# Patient Record
Sex: Male | Born: 1960 | Race: White | Hispanic: No | Smoking: Current every day smoker
Health system: Southern US, Community
[De-identification: ages and names within clinical notes are randomized; demographics above are authoritative.]

---

## 2005-10-19 ENCOUNTER — Ambulatory Visit: Payer: Self-pay | Admitting: Unknown Physician Specialty

## 2008-02-06 IMAGING — CT CT NECK WITH CONTRAST
1 of 2 series · 9 of 14 positions shown, 12 images · IV contrast (agent unspecified)
Comparison: none

REASON FOR EXAM: NECK MASS, SWELLING
COMMENTS:

PROCEDURE:     CT  - CT NECK WITH CONTRAST  - October 19, 2005  [DATE]
RESULT:
HISTORY: Neck mass, swelling.

[Series 2: soft tissue · axial · 0.44mm/px · z∈[-284,-44]mm · 9 of 101 slices shown, 12 images]
[im 11/101  soft-tissue]
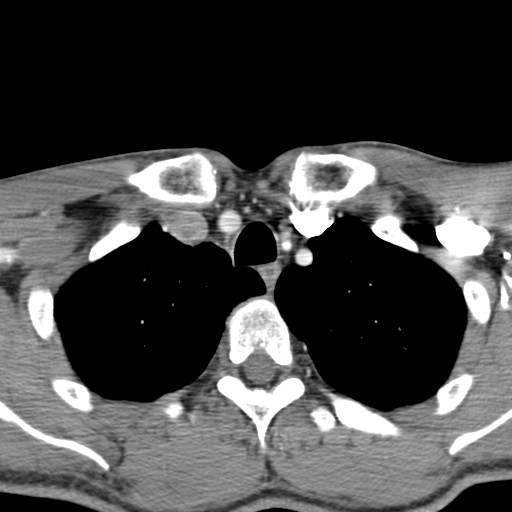
[im 11/101  bone]
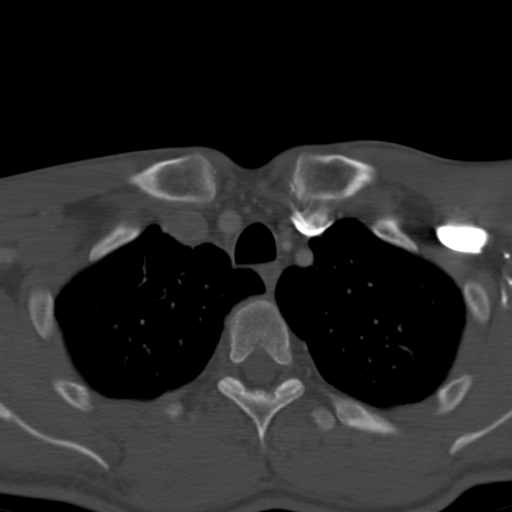
[im 21/101  bone]
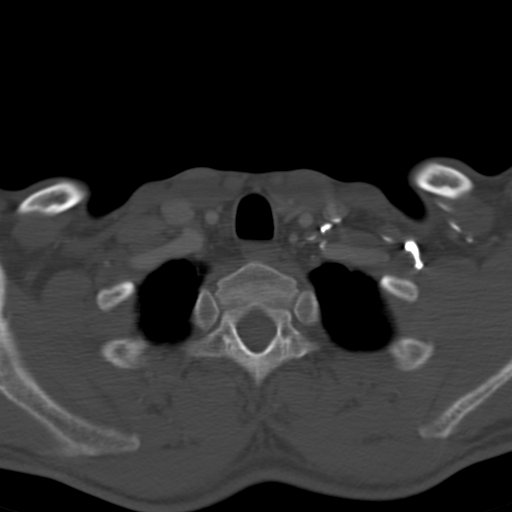
[im 31/101  bone]
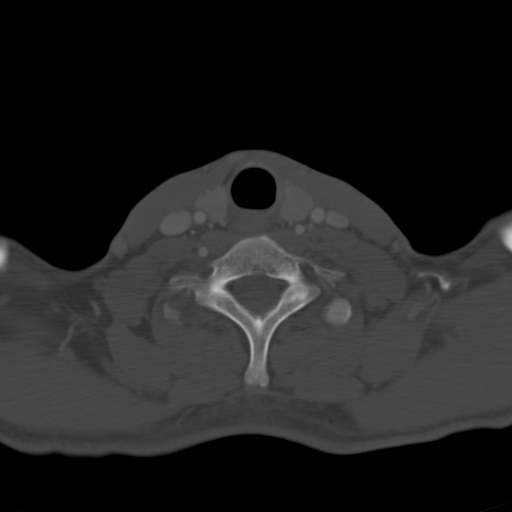
[im 41/101  bone]
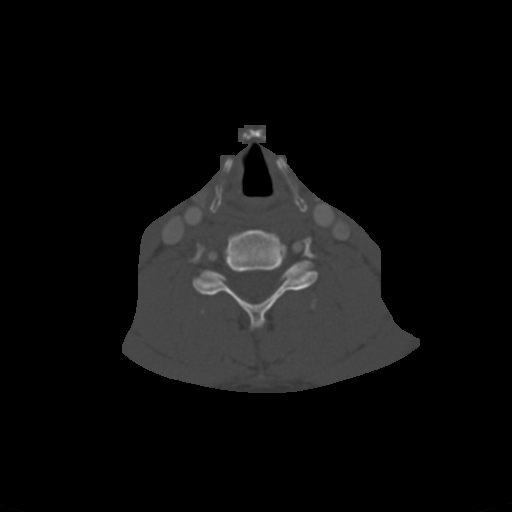
[im 51/101  soft-tissue]
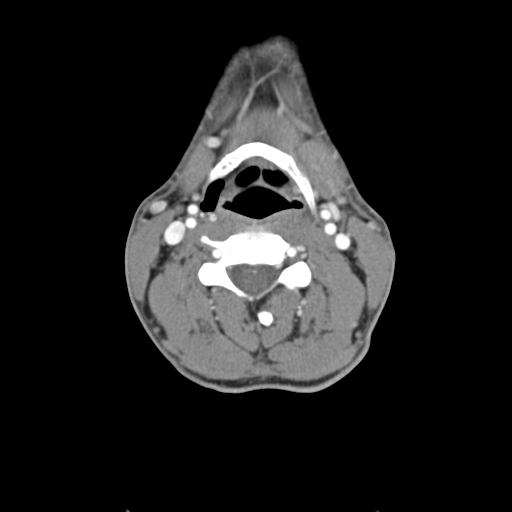
[im 51/101  bone]
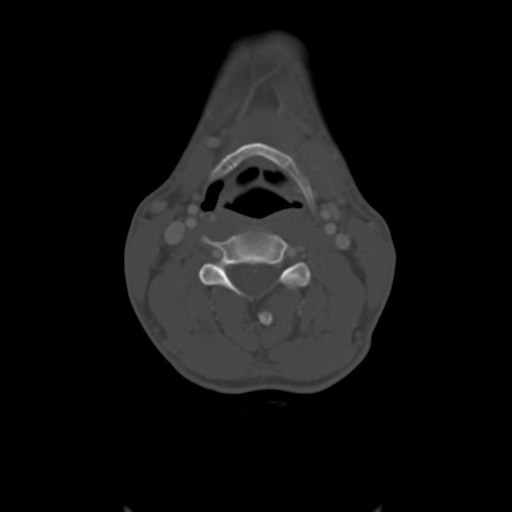
[im 61/101  bone]
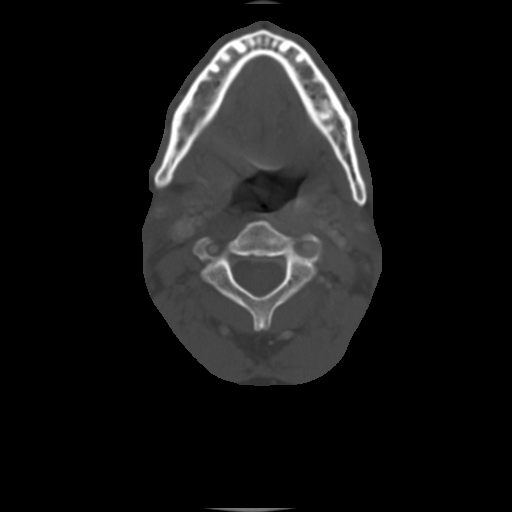
[im 71/101  bone]
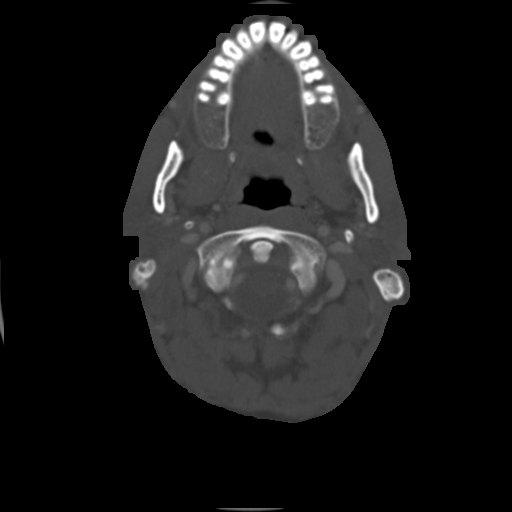
[im 81/101  bone]
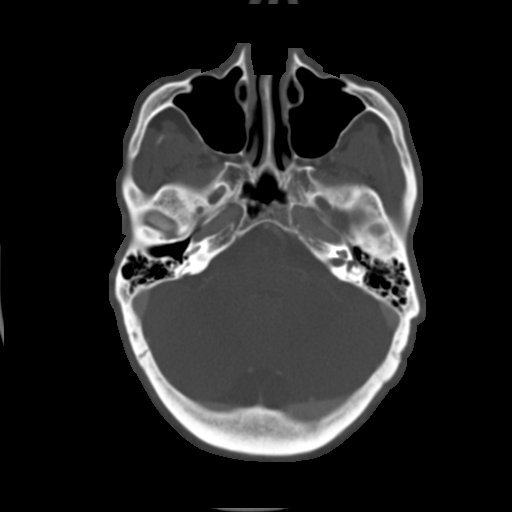
[im 91/101  soft-tissue]
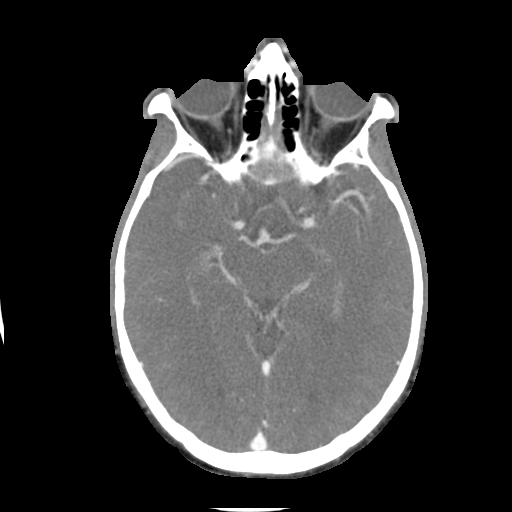
[im 91/101  bone]
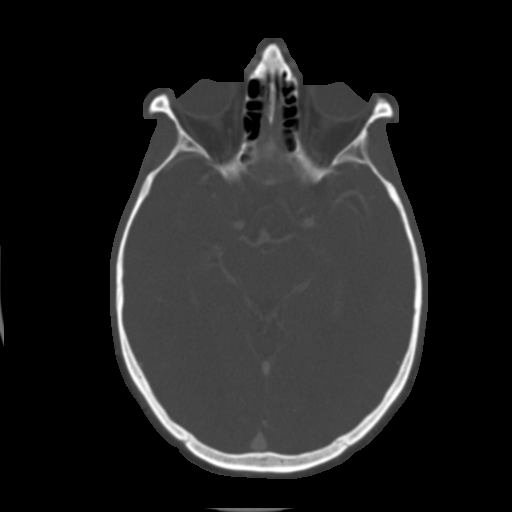

[9 of 14 positions shown; findings below may reference images not displayed]

COMPARISON STUDIES:      None.

PROCEDURE AND FINDINGS:      The skull base is normal.  The tongue base is
normal.  The parotids and submandibular glands are normal.  The larynx is
normal.  No pathologic cervical adenopathy is noted.  The thyroid is normal.
 The trachea and superior mediastinum are normal.  The nasopharynx is clear.
 Noted in the oropharynx in the region of the RIGHT fossa of Rosenm'fcller
is mild soft tissue prominence for which laryngoscopic evaluation is
suggested.  The larynx is unremarkable.  The pulmonary apices are normal
except for bullous changes of COPD.
IMPRESSION: 1.     Soft tissue swelling in the region of the RIGHT oropharynx adjacent
to the fossa of Rosenm'fcller.  Laryngoscopic evaluation is suggested.
2.     Changes of bullous COPD are noted in the pulmonary apices.  The exam
is otherwise unremarkable.

## 2012-03-06 ENCOUNTER — Observation Stay: Payer: Self-pay | Admitting: Surgery

## 2012-03-06 ENCOUNTER — Ambulatory Visit: Payer: Self-pay | Admitting: Internal Medicine

## 2012-03-06 LAB — URINALYSIS, COMPLETE
Blood: NEGATIVE
Glucose,UR: NEGATIVE mg/dL (ref 0–75)
Leukocyte Esterase: NEGATIVE
Nitrite: NEGATIVE
Ph: 5 (ref 4.5–8.0)
Protein: NEGATIVE
RBC,UR: 1 /HPF (ref 0–5)
WBC UR: 1 /HPF (ref 0–5)

## 2012-03-06 LAB — COMPREHENSIVE METABOLIC PANEL
Albumin: 3.7 g/dL (ref 3.4–5.0)
Anion Gap: 8 (ref 7–16)
BUN: 11 mg/dL (ref 7–18)
Calcium, Total: 9 mg/dL (ref 8.5–10.1)
Chloride: 102 mmol/L (ref 98–107)
Co2: 27 mmol/L (ref 21–32)
EGFR (African American): >60
EGFR (Non-African Amer.): >60
Glucose: 103 mg/dL — ABNORMAL HIGH (ref 65–99)
Osmolality: 273 (ref 275–301)
Potassium: 4.1 mmol/L (ref 3.5–5.1)
SGOT(AST): 11 U/L — ABNORMAL LOW (ref 15–37)
SGPT (ALT): 25 U/L (ref 12–78)
Total Protein: 7.7 g/dL (ref 6.4–8.2)

## 2012-03-06 LAB — CBC
HCT: 43.3 % (ref 40.0–52.0)
HGB: 14.9 g/dL (ref 13.0–18.0)
MCH: 31.7 pg (ref 26.0–34.0)
MCHC: 34.4 g/dL (ref 32.0–36.0)
Platelet: 242 10*3/uL (ref 150–440)
WBC: 16.7 10*3/uL — ABNORMAL HIGH (ref 3.8–10.6)

## 2012-03-07 LAB — CBC WITH DIFFERENTIAL/PLATELET
Basophil #: 0.1 10*3/uL (ref 0.0–0.1)
Basophil %: 0.5 %
HCT: 40.9 % (ref 40.0–52.0)
HGB: 13.5 g/dL (ref 13.0–18.0)
Lymphocyte #: 2.4 10*3/uL (ref 1.0–3.6)
Lymphocyte %: 21.1 %
MCH: 30.6 pg (ref 26.0–34.0)
MCHC: 33 g/dL (ref 32.0–36.0)
MCV: 93 fL (ref 80–100)
Monocyte #: 0.9 x10 3/mm (ref 0.2–1.0)
Neutrophil #: 7.7 10*3/uL — ABNORMAL HIGH (ref 1.4–6.5)
Platelet: 225 10*3/uL (ref 150–440)
RDW: 13.6 % (ref 11.5–14.5)

## 2014-08-18 NOTE — H&P (Signed)
PATIENT NAME:  Sean Trevino, Sean Trevino MR#:  [id number] DATE OF BIRTH:  1961/03/10  DATE OF ADMISSION:  03/06/2012  ADMITTING DIAGNOSES:  1. Contained perforation sigmoid diverticulitis.  2. Tobacco abuse and dependence.   HISTORY: This is a 54 year old, healthy, active white male who developed the rather sudden onset of left lower quadrant and suprapubic abdominal pain last night at 9:30 p.m. associated with subjective fever. Over the course of the evening and most of today the pain became excruciating, then became better. The patient was prompted by his family to seek attention at the medical center. Work-up in the Emergency Room demonstrated leukocytosis and a CT scan concerning for diverticular disease in the sigmoid colon. As such, surgical services were asked to consult. The patient denies any previous abdominal pain like this in the past.   ALLERGIES: None.   MEDICATIONS: None.   PAST MEDICAL HISTORY: Complicated shoulder surgery.   PAST SURGICAL HISTORY: As described above.   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 He smokes a pack a day. Does not drink.   FAMILY HISTORY: Noncontributory.   REVIEW OF SYSTEMS: Significant for abdominal pain as described above. The remaining ten-point review is negative.   PHYSICAL EXAMINATION:  GENERAL: The patient is a thin, alert and oriented, well developed white male.   VITAL SIGNS: Temperature 98.3, pulse 92, respiratory rate 18, blood pressure 122/68.   HEENT: Affect is normal. Facies are symmetrical.   NECK: Supple. No adenopathy.   LUNGS: Clear.   HEART: Regular rate and rhythm.   ABDOMEN: Soft, mildly distended with tenderness in the suprapubic region, left lower quadrant. There is no peritoneal sign. No scars. No hernias. No masses.   RECTAL/GU: Deferred.   EXTREMITIES: Warm and well perfused.   NEUROLOGIC/PSYCHIATRIC: Normal.   MUSCULOSKELETAL: Normal.   LABORATORY, DIAGNOSTIC, AND RADIOLOGICAL  DATA:  Urinalysis negative. Electrolytes are unremarkable. White count 16.7, hemoglobin 14.9, platelet count 242,000.   Review of x-rays: I personally reviewed the CT scan on the PACS monitor. It is performed without intravenous and oral contrast. There is an area of inflammatory changes measuring a little under 3 cm in the sigmoid colon with evidence of extraluminal air seen on the mesenteric aspect of the sigmoid colon consistent with a contained perforation. There was no free fluid. No other free air. Remaining intraabdominal organs both solid and visceral appear to be unremarkable.   IMPRESSION: This is a 54 year old previously healthy white male with a sigmoid colon diverticular microperforation.   PLAN: The patient will be admitted for clear liquids, intravenous antibiotics, bowel rest, serial abdominal examinations. I do not see an indication for surgical intervention at this time.   ____________________________ Redge GainerMark A. Egbert GaribaldiBird, MD mab:bjt D:  03/06/2012 20:27:15 ET          T: 03/07/2012 07:53:59 ET         JOB#: [id number]  Sean A Marieke Lubke MD ELECTRONICALLY SIGNED 03/10/2012 8:58

## 2015-05-09 ENCOUNTER — Encounter: Payer: Self-pay | Admitting: Emergency Medicine

## 2015-05-09 ENCOUNTER — Emergency Department
Admission: EM | Admit: 2015-05-09 | Discharge: 2015-05-09 | Disposition: A | Discharge status: Home / Self Care | Payer: Self-pay | Attending: Emergency Medicine | Admitting: Emergency Medicine

## 2015-05-09 DIAGNOSIS — J0100 Acute maxillary sinusitis, unspecified: Secondary | ICD-10-CM | POA: Insufficient documentation

## 2015-05-09 DIAGNOSIS — F17200 Nicotine dependence, unspecified, uncomplicated: Secondary | ICD-10-CM | POA: Insufficient documentation

## 2015-05-09 MED ORDER — AMOXICILLIN-POT CLAVULANATE 875-125 MG PO TABS: 1.0000 | ORAL_TABLET | Freq: Two times a day (BID) | ORAL | Status: AC

## 2015-05-09 MED ORDER — GUAIFENESIN 400 MG PO TABS: 400.0000 mg | ORAL_TABLET | ORAL | Status: DC

## 2015-05-09 MED ORDER — PSEUDOEPHEDRINE HCL 60 MG PO TABS: 60.0000 mg | ORAL_TABLET | ORAL | Status: DC | PRN

## 2015-05-09 NOTE — Discharge Instructions (Signed)
Sinusitis, Adult °Sinusitis is redness, soreness, and inflammation of the paranasal sinuses. Paranasal sinuses are air pockets within the bones of your face. They are located beneath your eyes, in the middle of your forehead, and above your eyes. In healthy paranasal sinuses, mucus is able to drain out, and air is able to circulate through them by way of your nose. However, when your paranasal sinuses are inflamed, mucus and air can become trapped. This can allow bacteria and other germs to grow and cause infection. °Sinusitis can develop quickly and last only a short time (acute) or continue over a long period (chronic). Sinusitis that lasts for more than 12 weeks is considered chronic. °CAUSES °Causes of sinusitis include: °· Allergies. °· Structural abnormalities, such as displacement of the cartilage that separates your nostrils (deviated septum), which can decrease the air flow through your nose and sinuses and affect sinus drainage. °· Functional abnormalities, such as when the small hairs (cilia) that line your sinuses and help remove mucus do not work properly or are not present. °SIGNS AND SYMPTOMS °Symptoms of acute and chronic sinusitis are the same. The primary symptoms are pain and pressure around the affected sinuses. Other symptoms include: °· Upper toothache. °· Earache. °· Headache. °· Bad breath. °· Decreased sense of smell and taste. °· A cough, which worsens when you are lying flat. °· Fatigue. °· Fever. °· Thick drainage from your nose, which often is green and may contain pus (purulent). °· Swelling and warmth over the affected sinuses. °DIAGNOSIS °Your health care provider will perform a physical exam. During your exam, your health care provider may perform any of the following to help determine if you have acute sinusitis or chronic sinusitis: °· Look in your nose for signs of abnormal growths in your nostrils (nasal polyps). °· Tap over the affected sinus to check for signs of  infection. °· View the inside of your sinuses using an imaging device that has a light attached (endoscope). °If your health care provider suspects that you have chronic sinusitis, one or more of the following tests may be recommended: °· Allergy tests. °· Nasal culture. A sample of mucus is taken from your nose, sent to a lab, and screened for bacteria. °· Nasal cytology. A sample of mucus is taken from your nose and examined by your health care provider to determine if your sinusitis is related to an allergy. °TREATMENT °Most cases of acute sinusitis are related to a viral infection and will resolve on their own within 10 days. Sometimes, medicines are prescribed to help relieve symptoms of both acute and chronic sinusitis. These may include pain medicines, decongestants, nasal steroid sprays, or saline sprays. °However, for sinusitis related to a bacterial infection, your health care provider will prescribe antibiotic medicines. These are medicines that will help kill the bacteria causing the infection. °Rarely, sinusitis is caused by a fungal infection. In these cases, your health care provider will prescribe antifungal medicine. °For some cases of chronic sinusitis, surgery is needed. Generally, these are cases in which sinusitis recurs more than 3 times per year, despite other treatments. °HOME CARE INSTRUCTIONS °· Drink plenty of water. Water helps thin the mucus so your sinuses can drain more easily. °· Use a humidifier. °· Inhale steam 3-4 times a day (for example, sit in the bathroom with the shower running). °· Apply a warm, moist washcloth to your face 3-4 times a day, or as directed by your health care provider. °· Use saline nasal sprays to help   moisten and clean your sinuses. °· Take medicines only as directed by your health care provider. °· If you were prescribed either an antibiotic or antifungal medicine, finish it all even if you start to feel better. °SEEK IMMEDIATE MEDICAL CARE IF: °· You have  increasing pain or severe headaches. °· You have nausea, vomiting, or drowsiness. °· You have swelling around your face. °· You have vision problems. °· You have a stiff neck. °· You have difficulty breathing. °  °This information is not intended to replace advice given to you by your health care provider. Make sure you discuss any questions you have with your health care provider. °  °Document Released: 04/17/2005 Document Revised: 05/08/2014 Document Reviewed: 05/02/2011 °Elsevier Interactive Patient Education ©2016 [employer] ° °Upper Respiratory Infection, Adult °Most upper respiratory infections (URIs) are caused by a virus. A URI affects the nose, throat, and upper air passages. The most common type of URI is often called "the common cold." °HOME CARE  °· Take medicines only as told by your doctor. °· Gargle warm saltwater or take cough drops to comfort your throat as told by your doctor. °· Use a warm mist humidifier or inhale steam from a shower to increase air moisture. This may make it easier to breathe. °· Drink enough fluid to keep your pee (urine) clear or pale yellow. °· Eat soups and other clear broths. °· Have a healthy diet. °· Rest as needed. °· Go back to work when your fever is gone or your doctor says it is okay. °¨ You may need to stay home longer to avoid giving your URI to others. °¨ You can also wear a face mask and wash your hands often to prevent spread of the virus. °· Use your inhaler more if you have asthma. °· Do not use any tobacco products, including cigarettes, chewing tobacco, or electronic cigarettes. If you need help quitting, ask your doctor. °GET HELP IF: °· You are getting worse, not better. °· Your symptoms are not helped by medicine. °· You have chills. °· You are getting more short of breath. °· You have brown or red mucus. °· You have yellow or brown discharge from your nose. °· You have pain in your face, especially when you bend forward. °· You have a fever. °· You  have puffy (swollen) neck glands. °· You have pain while swallowing. °· You have white areas in the back of your throat. °GET HELP RIGHT AWAY IF:  °· You have very bad or constant: °¨ Headache. °¨ Ear pain. °¨ Pain in your forehead, behind your eyes, and over your cheekbones (sinus pain). °¨ Chest pain. °· You have long-lasting (chronic) lung disease and any of the following: °¨ Wheezing. °¨ Long-lasting cough. °¨ Coughing up blood. °¨ A change in your usual mucus. °· You have a stiff neck. °· You have changes in your: °¨ Vision. °¨ Hearing. °¨ Thinking. °¨ Mood. °MAKE SURE YOU:  °· Understand these instructions. °· Will watch your condition. °· Will get help right away if you are not doing well or get worse. °  °This information is not intended to replace advice given to you by your health care provider. Make sure you discuss any questions you have with your health care provider. °  °Document Released: 10/04/2007 Document Revised: 09/01/2014 Document Reviewed: 07/23/2013 °Elsevier Interactive Patient Education ©2016 [employer] Inc. ° °

## 2015-05-09 NOTE — ED Notes (Signed)
Reports sinus congestion and facial; pain 

## 2015-05-09 NOTE — ED Provider Notes (Signed)
[facility] Emergency Department Provider Note  ____________________________________________  Time seen: Approximately 11:34 AM  I have reviewed the triage vital signs and the nursing notes.   HISTORY  Chief Complaint Facial Pain and Nasal Congestion    HPI Sean Trevino is a 55 y.o. male resents for evaluation of sinus congestion facial pain. Patient states upper respiratory symptoms for about one week and just noticed an increased pressure last night and today. Denies any fever chills denies any cough.   History reviewed. No pertinent past medical history.  There are no active problems to display for this patient.   History reviewed. No pertinent past surgical history.  Current Outpatient Rx  Name  Route  Sig  Dispense  Refill  . amoxicillin-clavulanate (AUGMENTIN) 875-125 MG tablet   Oral   Take 1 tablet by mouth 2 (two) times daily.   14 tablet   0   . guaifenesin (HUMIBID E) 400 MG TABS tablet   Oral   Take 1 tablet (400 mg total) by mouth every 4 (four) hours.   24 tablet   0   . pseudoephedrine (SUDAFED) 60 MG tablet   Oral   Take 1 tablet (60 mg total) by mouth every 4 (four) hours as needed for congestion.   24 tablet   0     Allergies Review of patient's allergies indicates no known allergies.  History reviewed. No pertinent family history.  Social History Social History  Substance Use Topics  . Smoking status: Current Every Day Smoker  . Smokeless tobacco: None  . Alcohol Use: None    Review of Systems Constitutional: No fever/chills Eyes: No visual changes. ENT: Positive for left frontal maxillary and ethmoid sinus tenderness. Cardiovascular: Denies chest pain. Respiratory: Denies shortness of breath. Denies cough Gastrointestinal: No abdominal pain.  No nausea, no vomiting.  No diarrhea.  No constipation. Genitourinary: Negative for dysuria. Musculoskeletal: Negative for back pain. Skin: Negative for  rash. Neurological: Negative for headaches, focal weakness or numbness.  10-point ROS otherwise negative.  ____________________________________________   PHYSICAL EXAM:  VITAL SIGNS: ED Triage Vitals  Enc Vitals Group     BP 05/09/15 1112 127/79 mmHg     Pulse Rate 05/09/15 1112 60     Resp 05/09/15 1112 18     Temp 05/09/15 1112 98 F (36.7 C)     Temp Source 05/09/15 1112 Oral     SpO2 05/09/15 1112 100 %     Weight --      Height --      Head Cir --      Peak Flow --      Pain Score 05/09/15 1104 8     Pain Loc --      Pain Edu? --      Excl. in GC? --     Constitutional: Alert and oriented. Well appearing and in no acute distress. Eyes: Conjunctivae are normal. PERRL. EOMI. Head: Atraumatic. Nose: Positive congestion/rhinnorhea. Nasal turbinates swollen bilaterally Mouth/Throat: Mucous membranes are moist.  Oropharynx non-erythematous. Neck: No stridor.  As of cervical adenopathy noted Cardiovascular: Normal rate, regular rhythm. Grossly normal heart sounds.  Good peripheral circulation. Respiratory: Normal respiratory effort.  No retractions. Lungs CTAB. Musculoskeletal: No lower extremity tenderness nor edema.  No joint effusions. Neurologic:  Normal speech and language. No gross focal neurologic deficits are appreciated. No gait instability. Skin:  Skin is warm, dry and intact. No rash noted. Psychiatric: Mood and affect are normal. Speech and behavior are normal.  ____________________________________________   LABS (all labs ordered are listed, but only abnormal results are displayed)  Labs Reviewed - No data to display ____________________________________________    PROCEDURES  Procedure(s) performed: None  Critical Care performed: No  ____________________________________________   INITIAL IMPRESSION / ASSESSMENT AND PLAN / ED COURSE  Pertinent labs & imaging results that were available during my care of the patient were reviewed by me and  considered in my medical decision making (see chart for details).  Acute sinusitis. Rx given for Augmentin 875 twice a day. Sudafed 60 mg every 6 hours. Guaifenesin 400 mg Patient to follow up with PCP or return to the ER with any worsening symptomology. ____________________________________________   FINAL CLINICAL IMPRESSION(S) / ED DIAGNOSES  Final diagnoses:  Acute maxillary sinusitis, recurrence not specified      Evangeline Dakinharles M Beers, PA-C 05/09/15 1140  Rockne MenghiniAnne-Caroline Norman, MD 05/09/15 1504

## 2017-04-05 ENCOUNTER — Other Ambulatory Visit: Payer: Self-pay

## 2017-04-05 ENCOUNTER — Emergency Department
Admission: EM | Admit: 2017-04-05 | Discharge: 2017-04-05 | Disposition: A | Discharge status: Home / Self Care | Payer: Self-pay | Attending: Emergency Medicine | Admitting: Emergency Medicine

## 2017-04-05 DIAGNOSIS — F17210 Nicotine dependence, cigarettes, uncomplicated: Secondary | ICD-10-CM | POA: Insufficient documentation

## 2017-04-05 DIAGNOSIS — J0100 Acute maxillary sinusitis, unspecified: Secondary | ICD-10-CM | POA: Insufficient documentation

## 2017-04-05 MED ORDER — FLUTICASONE PROPIONATE 50 MCG/ACT NA SUSP: 2.0000 | Freq: Every day | NASAL | 2 refills | Status: DC

## 2017-04-05 MED ORDER — PREDNISONE 10 MG PO TABS: 30.0000 mg | ORAL_TABLET | Freq: Every day | ORAL | 0 refills | Status: AC

## 2017-04-05 MED ORDER — AMOXICILLIN 875 MG PO TABS: 875.0000 mg | ORAL_TABLET | Freq: Two times a day (BID) | ORAL | 0 refills | Status: AC

## 2017-04-05 NOTE — Discharge Instructions (Signed)
Follow up with your regular doctor or the acute care if not better in 3-5 days, use medication as prescribed, return if worsening

## 2017-04-05 NOTE — ED Triage Notes (Signed)
Pt c/o sinus pressure and pain for the past 2 weeks with congestion

## 2017-04-05 NOTE — ED Provider Notes (Signed)
[facility] Emergency Department Provider Note  ____________________________________________   First MD Initiated Contact with Patient 04/05/17 1004     (approximate)  I have reviewed the triage vital signs and the nursing notes.   HISTORY  Chief Complaint Facial Pain    HPI Royce MacadamiaMark Santillo is a 56 y.o. male complains of facial pain, sinus pressure, congestion with yellow to green mucus, slight cough with yellow mucus, denies fever, chills, chest pain or shortness of breath   History reviewed. No pertinent past medical history.  There are no active problems to display for this patient.   History reviewed. No pertinent surgical history.  Prior to Admission medications   Medication Sig Start Date End Date Taking? Authorizing Provider  amoxicillin (AMOXIL) 875 MG tablet Take 1 tablet (875 mg total) by mouth 2 (two) times daily. 04/05/17   Sherrie MustacheFisher, Roselyn BeringSusan W, PA  fluticasone (FLONASE) 50 MCG/ACT nasal spray Place 2 sprays into both nostrils daily. 04/05/17   Faythe GheeFisher, Deandrae Wajda W, PA  predniSONE (DELTASONE) 10 MG tablet Take 3 tablets (30 mg total) by mouth daily with breakfast. 04/05/17   Faythe GheeFisher, Jazon Jipson W, PA    Allergies Patient has no known allergies.  No family history on file.  Social History Social History   Tobacco Use  . Smoking status: Current Every Day Smoker  . Smokeless tobacco: Never Used  Substance Use Topics  . Alcohol use: No    Frequency: Never  . Drug use: No    Review of Systems  Constitutional: No fever/chills Eyes: No visual changes. ENT: No sore throat. Positive runny nose and congestion Respiratory:  cough Genitourinary: Negative for dysuria. Musculoskeletal: Negative for back pain. Skin: Negative for rash.    ____________________________________________   PHYSICAL EXAM:  VITAL SIGNS: ED Triage Vitals  Enc Vitals Group     BP 04/05/17 0922 126/84     Pulse Rate 04/05/17 0922 69     Resp 04/05/17 0922 16     Temp  04/05/17 0922 98.1 F (36.7 C)     Temp Source 04/05/17 0922 Oral     SpO2 04/05/17 0922 99 %     Weight 04/05/17 0922 120 lb (54.4 kg)     Height 04/05/17 0922 5\' 10"  (1.778 m)     Head Circumference --      Peak Flow --      Pain Score 04/05/17 0921 7     Pain Loc --      Pain Edu? --      Excl. in GC? --     Constitutional: Alert and oriented. Well appearing and in no acute distress. Eyes: Conjunctivae are normal.  Head: Atraumatic. Ears with dull TMs Nose: No congestion/rhinnorhea. Nasal mucosa swollen Mouth/Throat: Mucous membranes are moist.  There is positive for postnasal drip Cardiovascular: Normal rate, regular rhythm. Heart Sounds are normal Respiratory: Normal respiratory effort.  No retractions, lungs are clear to auscultation GU: deferred Musculoskeletal: FROM all extremities, warm and well perfused Neurologic:  Normal speech and language.  Skin:  Skin is warm, dry and intact. No rash noted. Psychiatric: Mood and affect are normal. Speech and behavior are normal.  ____________________________________________   LABS (all labs ordered are listed, but only abnormal results are displayed)  Labs Reviewed - No data to display ____________________________________________   ____________________________________________  RADIOLOGY    ____________________________________________   PROCEDURES  Procedure(s) performed: No      ____________________________________________   INITIAL IMPRESSION / ASSESSMENT AND PLAN / ED COURSE  Pertinent labs & imaging results that were available during my care of the patient were reviewed by me and considered in my medical decision making (see chart for details).  56 year old male with acute sinusitis, prescription for amoxicillin, prednisone 30 mg daily for 3 days, and Flonase, he was instructed to follow-up with his family doctor or return to the emergency room if he is worsening, he is to use normal saline nasal rinse to  prevent sinus infection      ____________________________________________   FINAL CLINICAL IMPRESSION(S) / ED DIAGNOSES  Final diagnoses:  Acute maxillary sinusitis, recurrence not specified      NEW MEDICATIONS STARTED DURING THIS VISIT:  This SmartLink is deprecated. Use AVSMEDLIST instead to display the medication list for a patient.   Note:  This document was prepared using Dragon voice recognition software and may include unintentional dictation errors.    Faythe GheeFisher, Mylissa Lambe W, PA 04/05/17 1033    Jeanmarie PlantMcShane, James A, MD 04/05/17 [phone]

## 2017-04-05 NOTE — ED Notes (Signed)
See triage note  Presents with a 2 week hx of sinus pressure  States sinus pressure is mainly on th left unsure of fever afebrile on arrival

## 2018-06-11 ENCOUNTER — Other Ambulatory Visit: Payer: Self-pay

## 2018-06-11 ENCOUNTER — Encounter: Payer: Self-pay | Admitting: *Deleted

## 2018-06-11 ENCOUNTER — Emergency Department
Admission: EM | Admit: 2018-06-11 | Discharge: 2018-06-11 | Disposition: A | Discharge status: Home / Self Care | Payer: Self-pay | Attending: Emergency Medicine | Admitting: Emergency Medicine

## 2018-06-11 DIAGNOSIS — Z5321 Procedure and treatment not carried out due to patient leaving prior to being seen by health care provider: Secondary | ICD-10-CM | POA: Insufficient documentation

## 2018-06-11 DIAGNOSIS — R51 Headache: Secondary | ICD-10-CM | POA: Insufficient documentation

## 2018-06-11 DIAGNOSIS — R0981 Nasal congestion: Secondary | ICD-10-CM | POA: Insufficient documentation

## 2018-06-11 NOTE — ED Triage Notes (Signed)
Pt reporting sinus congestion and facial pain x 2 days. No fevers. No cough.

## 2019-07-12 ENCOUNTER — Emergency Department
Admission: EM | Admit: 2019-07-12 | Discharge: 2019-07-12 | Disposition: A | Discharge status: Home / Self Care | Payer: Self-pay | Attending: Student | Admitting: Student

## 2019-07-12 ENCOUNTER — Other Ambulatory Visit: Payer: Self-pay

## 2019-07-12 ENCOUNTER — Encounter: Payer: Self-pay | Admitting: Emergency Medicine

## 2019-07-12 DIAGNOSIS — F17210 Nicotine dependence, cigarettes, uncomplicated: Secondary | ICD-10-CM | POA: Insufficient documentation

## 2019-07-12 DIAGNOSIS — J0101 Acute recurrent maxillary sinusitis: Secondary | ICD-10-CM | POA: Insufficient documentation

## 2019-07-12 LAB — CBC WITH DIFFERENTIAL/PLATELET
Abs Immature Granulocytes: 0.02 10*3/uL (ref 0.00–0.07)
Basophils Absolute: 0.1 10*3/uL (ref 0.0–0.1)
Basophils Relative: 1 %
Eosinophils Absolute: 0.3 10*3/uL (ref 0.0–0.5)
Eosinophils Relative: 4 %
HCT: 46 % (ref 39.0–52.0)
Hemoglobin: 15.5 g/dL (ref 13.0–17.0)
Immature Granulocytes: 0 %
Lymphocytes Relative: 41 %
Lymphs Abs: 3.3 10*3/uL (ref 0.7–4.0)
MCH: 32 pg (ref 26.0–34.0)
MCHC: 33.7 g/dL (ref 30.0–36.0)
MCV: 95 fL (ref 80.0–100.0)
Monocytes Absolute: 0.6 10*3/uL (ref 0.1–1.0)
Monocytes Relative: 7 %
Neutro Abs: 3.9 10*3/uL (ref 1.7–7.7)
Neutrophils Relative %: 47 %
Platelets: 202 10*3/uL (ref 150–400)
RBC: 4.84 MIL/uL (ref 4.22–5.81)
RDW: 12.5 % (ref 11.5–15.5)
WBC: 8 10*3/uL (ref 4.0–10.5)
nRBC: 0 % (ref 0.0–0.2)

## 2019-07-12 LAB — BASIC METABOLIC PANEL
Anion gap: 12 (ref 5–15)
BUN: 16 mg/dL (ref 6–20)
CO2: 26 mmol/L (ref 22–32)
Calcium: 8.7 mg/dL — ABNORMAL LOW (ref 8.9–10.3)
Chloride: 99 mmol/L (ref 98–111)
Creatinine, Ser: 1.19 mg/dL (ref 0.61–1.24)
GFR calc Af Amer: >60 mL/min (ref >60)
GFR calc non Af Amer: >60 mL/min (ref >60)
Glucose, Bld: 95 mg/dL (ref 70–99)
Potassium: 3.8 mmol/L (ref 3.5–5.1)
Sodium: 137 mmol/L (ref 135–145)

## 2019-07-12 LAB — TROPONIN I (HIGH SENSITIVITY): Troponin I (High Sensitivity): 2 ng/L (ref <18)

## 2019-07-12 MED ORDER — FLUTICASONE PROPIONATE 50 MCG/ACT NA SUSP: 1.0000 | Freq: Two times a day (BID) | NASAL | 0 refills | Status: AC

## 2019-07-12 MED ORDER — AMOXICILLIN-POT CLAVULANATE 875-125 MG PO TABS: 1.0000 | ORAL_TABLET | Freq: Two times a day (BID) | ORAL | 0 refills | Status: AC

## 2019-07-12 MED ORDER — CETIRIZINE HCL 10 MG PO TABS: 10.0000 mg | ORAL_TABLET | Freq: Every day | ORAL | 0 refills | Status: AC

## 2019-07-12 MED ORDER — MELOXICAM 15 MG PO TABS: 15.0000 mg | ORAL_TABLET | Freq: Every day | ORAL | 0 refills | Status: AC

## 2019-07-12 MED ORDER — METHOCARBAMOL 500 MG PO TABS: 500.0000 mg | ORAL_TABLET | Freq: Four times a day (QID) | ORAL | 0 refills | Status: AC

## 2019-07-12 NOTE — ED Notes (Signed)
Patient declined discharge vital signs. 

## 2019-07-12 NOTE — ED Provider Notes (Signed)
[facility] Emergency Department Provider Note  ____________________________________________  Time seen: Approximately 6:12 PM  I have reviewed the triage vital signs and the nursing notes.   HISTORY  Chief Complaint Arm Pain and Sinus Problem    HPI Sean Trevino is a 59 y.o. male who presents the emergency department complaining of left sinus pressure, pain radiating from his sinuses to behind his ear and down his neck.  Patient states that he has similar symptoms when he has a sinus infection.  He typically has a sinus infection around this time a year.  Significant nasal congestion, no fevers or chills, sore throat, cough, shortness of breath.  Patient denies any chest pain.  He states that the pain does radiate into the left side of the neck but not down his arm.  Patient denies any other complaints.  No medications prior to arrival.         History reviewed. No pertinent past medical history.  There are no problems to display for this patient.   History reviewed. No pertinent surgical history.  Prior to Admission medications   Medication Sig Start Date End Date Taking? Authorizing Provider  amoxicillin (AMOXIL) 875 MG tablet Take 1 tablet (875 mg total) by mouth 2 (two) times daily. 04/05/17   Fisher, Roselyn Bering, PA-C  amoxicillin-clavulanate (AUGMENTIN) 875-125 MG tablet Take 1 tablet by mouth 2 (two) times daily. 07/12/19   Langdon Crosson, Delorise Royals, PA-C  cetirizine (ZYRTEC) 10 MG tablet Take 1 tablet (10 mg total) by mouth daily. 07/12/19   Truong Delcastillo, Delorise Royals, PA-C  fluticasone (FLONASE) 50 MCG/ACT nasal spray Place 1 spray into both nostrils 2 (two) times daily. 07/12/19   Lazar Tierce, Delorise Royals, PA-C  meloxicam (MOBIC) 15 MG tablet Take 1 tablet (15 mg total) by mouth daily. 07/12/19   Alaya Iverson, Delorise Royals, PA-C  methocarbamol (ROBAXIN) 500 MG tablet Take 1 tablet (500 mg total) by mouth 4 (four) times daily. 07/12/19   Emelie Newsom, Delorise Royals, PA-C   predniSONE (DELTASONE) 10 MG tablet Take 3 tablets (30 mg total) by mouth daily with breakfast. 04/05/17   Sherrie Mustache Roselyn Bering, PA-C    Allergies Patient has no known allergies.  No family history on file.  Social History Social History   Tobacco Use  . Smoking status: Current Every Day Smoker  . Smokeless tobacco: Never Used  Substance Use Topics  . Alcohol use: No  . Drug use: No     Review of Systems  Constitutional: No fever/chills Eyes: No visual changes. No discharge ENT: Positive for nasal congestion, sinus pressure, pain extending into the left side of the neck Cardiovascular: no chest pain. Respiratory: no cough. No SOB. Gastrointestinal: No abdominal pain.  No nausea, no vomiting.  No diarrhea.  No constipation. Musculoskeletal: Negative for musculoskeletal pain. Skin: Negative for rash, abrasions, lacerations, ecchymosis. Neurological: Negative for headaches, focal weakness or numbness. 10-point ROS otherwise negative.  ____________________________________________   PHYSICAL EXAM:  VITAL SIGNS: ED Triage Vitals [07/12/19 1720]  Enc Vitals Group     BP 127/81     Pulse Rate 86     Resp 16     Temp 98.9 F (37.2 C)     Temp Source Oral     SpO2 97 %     Weight 125 lb (56.7 kg)     Height      Head Circumference      Peak Flow      Pain Score 9     Pain Loc  Pain Edu?      Excl. in GC?      Constitutional: Alert and oriented. Well appearing and in no acute distress. Eyes: Conjunctivae are normal. PERRL. EOMI. Head: Atraumatic. ENT:      Ears:       Nose: Congestion, erythema of the left nares.  Patient is tender to percussion along the left maxillary sinus.      Mouth/Throat: Mucous membranes are moist.  Oropharynx is nonerythematous and nonedematous.  Uvula is midline. Neck: No stridor.  No cervical spine tenderness to palpation.  Neck is supple full range of motion Hematological/Lymphatic/Immunilogical: No cervical  lymphadenopathy. Cardiovascular: Normal rate, regular rhythm. Normal S1 and S2.  Good peripheral circulation. Respiratory: Normal respiratory effort without tachypnea or retractions. Lungs CTAB. Good air entry to the bases with no decreased or absent breath sounds. Musculoskeletal: Full range of motion to all extremities. No gross deformities appreciated. Neurologic:  Normal speech and language. No gross focal neurologic deficits are appreciated.  Skin:  Skin is warm, dry and intact. No rash noted. Psychiatric: Mood and affect are normal. Speech and behavior are normal. Patient exhibits appropriate insight and judgement.   ____________________________________________   LABS (all labs ordered are listed, but only abnormal results are displayed)  Labs Reviewed  BASIC METABOLIC PANEL - Abnormal; Notable for the following components:      Result Value   Calcium 8.7 (*)    All other components within normal limits  CBC WITH DIFFERENTIAL/PLATELET  TROPONIN I (HIGH SENSITIVITY)   ____________________________________________  EKG  ED ECG REPORT I, Delorise Royals Aerie Donica,  personally viewed and interpreted this ECG.   Date: 07/12/2019  EKG Time: 1720 hrs  Rate: 82 bpm  Rhythm: there are no previous tracings available for comparison, normal sinus rhythm  Axis: Normal axis  Intervals:none  ST&T Change: No ST elevation or depression noted  Normal sinus rhythm.  No STEMI.  ____________________________________________  RADIOLOGY   No results found.  ____________________________________________    PROCEDURES  Procedure(s) performed:    Procedures    Medications - No data to display   ____________________________________________   INITIAL IMPRESSION / ASSESSMENT AND PLAN / ED COURSE  Pertinent labs & imaging results that were available during my care of the patient were reviewed by me and considered in my medical decision making (see chart for details).  Review of  the [place] CSRS was performed in accordance of the [facility] prior to dispensing any controlled drugs.           Patient's diagnosis is consistent with maxillary sinusitis.  Patient presented to the emergency department complaining of left sinus congestion, left sinus pressure and pain radiating into the left side of the neck.  Given his complaints of left-sided neck pain, patient did have labs, EKG.  Cardiac work-up is reassuring and I have a low suspicion for cardiac etiology of patient's symptoms.  Findings are most consistent with maxillary sinusitis.  Patient will be placed on antibiotics, anti-inflammatories for same.  I do feel that this pain complaint is likely referred pain from his sinus infection but I will place the patient on anti-inflammatory and muscle relaxer for neck complaints.  Return precautions are discussed with the patient.  Follow-up primary care as needed..  Patient is given ED precautions to return to the ED for any worsening or new symptoms.     ____________________________________________  FINAL CLINICAL IMPRESSION(S) / ED DIAGNOSES  Final diagnoses:  Acute recurrent maxillary sinusitis      NEW MEDICATIONS  STARTED DURING THIS VISIT:  ED Discharge Orders         Ordered    amoxicillin-clavulanate (AUGMENTIN) 875-125 MG tablet  2 times daily     07/12/19 1924    fluticasone (FLONASE) 50 MCG/ACT nasal spray  2 times daily     07/12/19 1924    cetirizine (ZYRTEC) 10 MG tablet  Daily     07/12/19 1924    meloxicam (MOBIC) 15 MG tablet  Daily     07/12/19 1924    methocarbamol (ROBAXIN) 500 MG tablet  4 times daily     07/12/19 1924              This chart was dictated using voice recognition software/Dragon. Despite best efforts to proofread, errors can occur which can change the meaning. Any change was purely unintentional.    Darletta Moll, PA-C 07/12/19 2022    Lilia Pro., MD 07/13/19 1126

## 2019-07-12 NOTE — ED Triage Notes (Signed)
Pt to ED via POV c/o sinus pain and pressure. Pt states that the pain radiates down into his neck and left arm. Pt states that this happens once a year and always presents the same. Pt denies chest pain or shortness of breath.
